# Patient Record
Sex: Male | Born: 1940 | Race: White | Hispanic: No | Marital: Married | Smoking: Former smoker
Health system: Southern US, Community
[De-identification: ages and names within clinical notes are randomized; demographics above are authoritative.]

## PROBLEM LIST (undated history)

## (undated) DIAGNOSIS — G510 Bell's palsy: Secondary | ICD-10-CM

## (undated) DIAGNOSIS — E7800 Pure hypercholesterolemia, unspecified: Secondary | ICD-10-CM

## (undated) DIAGNOSIS — I10 Essential (primary) hypertension: Secondary | ICD-10-CM

## (undated) DIAGNOSIS — K219 Gastro-esophageal reflux disease without esophagitis: Secondary | ICD-10-CM

## (undated) DIAGNOSIS — I2510 Atherosclerotic heart disease of native coronary artery without angina pectoris: Secondary | ICD-10-CM

## (undated) HISTORY — PX: CARDIAC CATHETERIZATION: SHX172

---

## 2009-01-09 ENCOUNTER — Ambulatory Visit (HOSPITAL_COMMUNITY): Admission: RE | Admit: 2009-01-09 | Discharge: 2009-01-09 | Payer: Self-pay | Admitting: Family Medicine

## 2012-03-18 HISTORY — PX: CORONARY STENT PLACEMENT: SHX1402

## 2012-04-23 ENCOUNTER — Encounter (HOSPITAL_COMMUNITY): Payer: Self-pay

## 2012-04-23 ENCOUNTER — Encounter (HOSPITAL_COMMUNITY)
Admission: RE | Admit: 2012-04-23 | Discharge: 2012-04-23 | Disposition: A | Discharge status: Home / Self Care | Payer: Medicare Other | Source: Ambulatory Visit | Attending: Internal Medicine | Admitting: Internal Medicine

## 2012-04-23 VITALS — BP 104/62 | HR 66 | Ht 69.0 in | Wt 211.2 lb

## 2012-04-23 DIAGNOSIS — Z5189 Encounter for other specified aftercare: Secondary | ICD-10-CM | POA: Insufficient documentation

## 2012-04-23 DIAGNOSIS — Z9861 Coronary angioplasty status: Secondary | ICD-10-CM

## 2012-04-23 HISTORY — DX: Atherosclerotic heart disease of native coronary artery without angina pectoris: I25.10

## 2012-04-23 NOTE — Patient Instructions (Signed)
Pt has finished orientation and is scheduled to start CR on 04/29/12 at 9:30. Pt has been instructed to arrive to class 15 minutes early for scheduled class. Pt has been instructed to wear comfortable clothing and shoes with rubber soles. Pt has been told to take their medications 1 hour prior to coming to class.  If the patient is not going to attend class, he/she has been instructed to call.

## 2012-04-23 NOTE — Progress Notes (Signed)
Patient was referred to Cardiac Rehab from [facility] by Dr. 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 Reviewed attendance and class policy. Talked about inclement weather and class consultation policy. Pt is scheduled to start Cardiac Rehab on 04/29/12 at 9:30. Pt was advised to come to class 5 minutes before class starts. He was also given instructions on meeting with the dietician and attending the Family Structure classes. Pt is eager to get started. Patient did 6 minute pre walk test.

## 2012-04-27 ENCOUNTER — Encounter (HOSPITAL_COMMUNITY): Payer: Medicare Other

## 2012-04-29 ENCOUNTER — Encounter (HOSPITAL_COMMUNITY)
Admission: RE | Admit: 2012-04-29 | Discharge: 2012-04-29 | Disposition: A | Discharge status: Home / Self Care | Payer: Medicare Other | Source: Ambulatory Visit | Attending: Internal Medicine | Admitting: Internal Medicine

## 2012-05-01 ENCOUNTER — Encounter (HOSPITAL_COMMUNITY)
Admission: RE | Admit: 2012-05-01 | Discharge: 2012-05-01 | Disposition: A | Discharge status: Home / Self Care | Payer: Medicare Other | Source: Ambulatory Visit | Attending: Internal Medicine | Admitting: Internal Medicine

## 2012-05-04 ENCOUNTER — Encounter (HOSPITAL_COMMUNITY)
Admission: RE | Admit: 2012-05-04 | Discharge: 2012-05-04 | Disposition: A | Discharge status: Home / Self Care | Payer: Medicare Other | Source: Ambulatory Visit | Attending: Internal Medicine | Admitting: Internal Medicine

## 2012-05-06 ENCOUNTER — Encounter (HOSPITAL_COMMUNITY): Payer: Medicare Other

## 2012-05-08 ENCOUNTER — Encounter (HOSPITAL_COMMUNITY)
Admission: RE | Admit: 2012-05-08 | Discharge: 2012-05-08 | Disposition: A | Discharge status: Home / Self Care | Payer: Medicare Other | Source: Ambulatory Visit | Attending: Internal Medicine | Admitting: Internal Medicine

## 2012-05-11 ENCOUNTER — Encounter (HOSPITAL_COMMUNITY)
Admission: RE | Admit: 2012-05-11 | Discharge: 2012-05-11 | Disposition: A | Discharge status: Home / Self Care | Payer: Medicare Other | Source: Ambulatory Visit | Attending: Internal Medicine | Admitting: Internal Medicine

## 2012-05-11 DIAGNOSIS — Z9861 Coronary angioplasty status: Secondary | ICD-10-CM | POA: Insufficient documentation

## 2012-05-11 DIAGNOSIS — Z5189 Encounter for other specified aftercare: Secondary | ICD-10-CM | POA: Insufficient documentation

## 2012-05-13 ENCOUNTER — Encounter (HOSPITAL_COMMUNITY)
Admission: RE | Admit: 2012-05-13 | Discharge: 2012-05-13 | Disposition: A | Discharge status: Home / Self Care | Payer: Medicare Other | Source: Ambulatory Visit | Attending: Internal Medicine | Admitting: Internal Medicine

## 2012-05-15 ENCOUNTER — Encounter (HOSPITAL_COMMUNITY)
Admission: RE | Admit: 2012-05-15 | Discharge: 2012-05-15 | Disposition: A | Discharge status: Home / Self Care | Payer: Medicare Other | Source: Ambulatory Visit | Attending: Internal Medicine | Admitting: Internal Medicine

## 2012-05-18 ENCOUNTER — Encounter (HOSPITAL_COMMUNITY)
Admission: RE | Admit: 2012-05-18 | Discharge: 2012-05-18 | Disposition: A | Discharge status: Home / Self Care | Payer: Medicare Other | Source: Ambulatory Visit | Attending: Internal Medicine | Admitting: Internal Medicine

## 2012-05-20 ENCOUNTER — Encounter (HOSPITAL_COMMUNITY)
Admission: RE | Admit: 2012-05-20 | Discharge: 2012-05-20 | Disposition: A | Discharge status: Home / Self Care | Payer: Medicare Other | Source: Ambulatory Visit | Attending: Internal Medicine | Admitting: Internal Medicine

## 2012-05-22 ENCOUNTER — Encounter (HOSPITAL_COMMUNITY): Payer: Medicare Other

## 2012-05-25 ENCOUNTER — Encounter (HOSPITAL_COMMUNITY)
Admission: RE | Admit: 2012-05-25 | Discharge: 2012-05-25 | Disposition: A | Discharge status: Home / Self Care | Payer: Medicare Other | Source: Ambulatory Visit | Attending: Internal Medicine | Admitting: Internal Medicine

## 2012-05-27 ENCOUNTER — Encounter (HOSPITAL_COMMUNITY)
Admission: RE | Admit: 2012-05-27 | Discharge: 2012-05-27 | Disposition: A | Discharge status: Home / Self Care | Payer: Medicare Other | Source: Ambulatory Visit | Attending: Internal Medicine | Admitting: Internal Medicine

## 2012-05-29 ENCOUNTER — Encounter (HOSPITAL_COMMUNITY)
Admission: RE | Admit: 2012-05-29 | Discharge: 2012-05-29 | Disposition: A | Discharge status: Home / Self Care | Payer: Medicare Other | Source: Ambulatory Visit | Attending: Internal Medicine | Admitting: Internal Medicine

## 2012-06-01 ENCOUNTER — Encounter (HOSPITAL_COMMUNITY)
Admission: RE | Admit: 2012-06-01 | Discharge: 2012-06-01 | Disposition: A | Discharge status: Home / Self Care | Payer: Medicare Other | Source: Ambulatory Visit | Attending: Internal Medicine | Admitting: Internal Medicine

## 2012-06-03 ENCOUNTER — Encounter (HOSPITAL_COMMUNITY)
Admission: RE | Admit: 2012-06-03 | Discharge: 2012-06-03 | Disposition: A | Discharge status: Home / Self Care | Payer: Medicare Other | Source: Ambulatory Visit | Attending: Internal Medicine | Admitting: Internal Medicine

## 2012-06-05 ENCOUNTER — Encounter (HOSPITAL_COMMUNITY)
Admission: RE | Admit: 2012-06-05 | Discharge: 2012-06-05 | Disposition: A | Discharge status: Home / Self Care | Payer: Medicare Other | Source: Ambulatory Visit | Attending: Internal Medicine | Admitting: Internal Medicine

## 2012-06-08 ENCOUNTER — Encounter (HOSPITAL_COMMUNITY)
Admission: RE | Admit: 2012-06-08 | Discharge: 2012-06-08 | Disposition: A | Discharge status: Home / Self Care | Payer: Medicare Other | Source: Ambulatory Visit | Attending: Internal Medicine | Admitting: Internal Medicine

## 2012-06-08 DIAGNOSIS — Z5189 Encounter for other specified aftercare: Secondary | ICD-10-CM | POA: Insufficient documentation

## 2012-06-08 DIAGNOSIS — Z9861 Coronary angioplasty status: Secondary | ICD-10-CM | POA: Insufficient documentation

## 2012-06-10 ENCOUNTER — Encounter (HOSPITAL_COMMUNITY)
Admission: RE | Admit: 2012-06-10 | Discharge: 2012-06-10 | Disposition: A | Discharge status: Home / Self Care | Payer: Medicare Other | Source: Ambulatory Visit | Attending: Internal Medicine | Admitting: Internal Medicine

## 2012-06-12 ENCOUNTER — Encounter (HOSPITAL_COMMUNITY): Payer: Medicare Other

## 2012-06-15 ENCOUNTER — Encounter (HOSPITAL_COMMUNITY)
Admission: RE | Admit: 2012-06-15 | Discharge: 2012-06-15 | Disposition: A | Discharge status: Home / Self Care | Payer: Medicare Other | Source: Ambulatory Visit | Attending: Internal Medicine | Admitting: Internal Medicine

## 2012-06-17 ENCOUNTER — Encounter (HOSPITAL_COMMUNITY)
Admission: RE | Admit: 2012-06-17 | Discharge: 2012-06-17 | Disposition: A | Discharge status: Home / Self Care | Payer: Medicare Other | Source: Ambulatory Visit | Attending: Internal Medicine | Admitting: Internal Medicine

## 2012-06-19 ENCOUNTER — Encounter (HOSPITAL_COMMUNITY)
Admission: RE | Admit: 2012-06-19 | Discharge: 2012-06-19 | Disposition: A | Discharge status: Home / Self Care | Payer: Medicare Other | Source: Ambulatory Visit | Attending: Internal Medicine | Admitting: Internal Medicine

## 2012-06-22 ENCOUNTER — Encounter (HOSPITAL_COMMUNITY)
Admission: RE | Admit: 2012-06-22 | Discharge: 2012-06-22 | Disposition: A | Discharge status: Home / Self Care | Payer: Medicare Other | Source: Ambulatory Visit | Attending: Internal Medicine | Admitting: Internal Medicine

## 2012-06-24 ENCOUNTER — Encounter (HOSPITAL_COMMUNITY)
Admission: RE | Admit: 2012-06-24 | Discharge: 2012-06-24 | Disposition: A | Discharge status: Home / Self Care | Payer: Medicare Other | Source: Ambulatory Visit | Attending: Internal Medicine | Admitting: Internal Medicine

## 2012-06-26 ENCOUNTER — Encounter (HOSPITAL_COMMUNITY)
Admission: RE | Admit: 2012-06-26 | Discharge: 2012-06-26 | Disposition: A | Discharge status: Home / Self Care | Payer: Medicare Other | Source: Ambulatory Visit | Attending: Internal Medicine | Admitting: Internal Medicine

## 2012-06-29 ENCOUNTER — Encounter (HOSPITAL_COMMUNITY)
Admission: RE | Admit: 2012-06-29 | Discharge: 2012-06-29 | Disposition: A | Discharge status: Home / Self Care | Payer: Medicare Other | Source: Ambulatory Visit | Attending: Internal Medicine | Admitting: Internal Medicine

## 2012-07-01 ENCOUNTER — Encounter (HOSPITAL_COMMUNITY)
Admission: RE | Admit: 2012-07-01 | Discharge: 2012-07-01 | Disposition: A | Discharge status: Home / Self Care | Payer: Medicare Other | Source: Ambulatory Visit | Attending: Internal Medicine | Admitting: Internal Medicine

## 2012-07-03 ENCOUNTER — Encounter (HOSPITAL_COMMUNITY)
Admission: RE | Admit: 2012-07-03 | Discharge: 2012-07-03 | Disposition: A | Discharge status: Home / Self Care | Payer: Medicare Other | Source: Ambulatory Visit | Attending: Internal Medicine | Admitting: Internal Medicine

## 2012-07-06 ENCOUNTER — Encounter (HOSPITAL_COMMUNITY)
Admission: RE | Admit: 2012-07-06 | Discharge: 2012-07-06 | Disposition: A | Discharge status: Home / Self Care | Payer: Medicare Other | Source: Ambulatory Visit | Attending: Internal Medicine | Admitting: Internal Medicine

## 2012-07-08 ENCOUNTER — Encounter (HOSPITAL_COMMUNITY)
Admission: RE | Admit: 2012-07-08 | Discharge: 2012-07-08 | Disposition: A | Discharge status: Home / Self Care | Payer: Medicare Other | Source: Ambulatory Visit | Attending: Internal Medicine | Admitting: Internal Medicine

## 2012-07-08 DIAGNOSIS — Z9861 Coronary angioplasty status: Secondary | ICD-10-CM | POA: Insufficient documentation

## 2012-07-08 DIAGNOSIS — Z5189 Encounter for other specified aftercare: Secondary | ICD-10-CM | POA: Insufficient documentation

## 2012-07-10 ENCOUNTER — Encounter (HOSPITAL_COMMUNITY)
Admission: RE | Admit: 2012-07-10 | Discharge: 2012-07-10 | Disposition: A | Discharge status: Home / Self Care | Payer: Medicare Other | Source: Ambulatory Visit | Attending: Internal Medicine | Admitting: Internal Medicine

## 2012-07-13 ENCOUNTER — Encounter (HOSPITAL_COMMUNITY)
Admission: RE | Admit: 2012-07-13 | Discharge: 2012-07-13 | Disposition: A | Discharge status: Home / Self Care | Payer: Medicare Other | Source: Ambulatory Visit | Attending: Internal Medicine | Admitting: Internal Medicine

## 2012-07-15 ENCOUNTER — Encounter (HOSPITAL_COMMUNITY)
Admission: RE | Admit: 2012-07-15 | Discharge: 2012-07-15 | Disposition: A | Discharge status: Home / Self Care | Payer: Medicare Other | Source: Ambulatory Visit | Attending: Internal Medicine | Admitting: Internal Medicine

## 2012-07-17 ENCOUNTER — Encounter (HOSPITAL_COMMUNITY)
Admission: RE | Admit: 2012-07-17 | Discharge: 2012-07-17 | Disposition: A | Discharge status: Home / Self Care | Payer: Medicare Other | Source: Ambulatory Visit | Attending: Internal Medicine | Admitting: Internal Medicine

## 2012-07-20 ENCOUNTER — Encounter (HOSPITAL_COMMUNITY)
Admission: RE | Admit: 2012-07-20 | Discharge: 2012-07-20 | Disposition: A | Discharge status: Home / Self Care | Payer: Medicare Other | Source: Ambulatory Visit | Attending: Internal Medicine | Admitting: Internal Medicine

## 2012-07-22 ENCOUNTER — Encounter (HOSPITAL_COMMUNITY)
Admission: RE | Admit: 2012-07-22 | Discharge: 2012-07-22 | Disposition: A | Discharge status: Home / Self Care | Payer: Medicare Other | Source: Ambulatory Visit | Attending: Internal Medicine | Admitting: Internal Medicine

## 2012-07-24 ENCOUNTER — Encounter (HOSPITAL_COMMUNITY)
Admission: RE | Admit: 2012-07-24 | Discharge: 2012-07-24 | Disposition: A | Discharge status: Home / Self Care | Payer: Medicare Other | Source: Ambulatory Visit | Attending: Internal Medicine | Admitting: Internal Medicine

## 2012-07-27 ENCOUNTER — Encounter (HOSPITAL_COMMUNITY)
Admission: RE | Admit: 2012-07-27 | Discharge: 2012-07-27 | Disposition: A | Discharge status: Home / Self Care | Payer: Medicare Other | Source: Ambulatory Visit | Attending: Internal Medicine | Admitting: Internal Medicine

## 2012-07-29 ENCOUNTER — Encounter (HOSPITAL_COMMUNITY): Payer: Medicare Other

## 2012-07-31 ENCOUNTER — Encounter (HOSPITAL_COMMUNITY): Payer: Medicare Other

## 2012-07-31 NOTE — Progress Notes (Signed)
Cardiac Rehabilitation Program Outcomes Report   Orientation:  04/23/2012 1st week Report 05/04/2012 Graduate Date:  tbd Discharge Date:  tbd # of sessions completed: 3  Cardiologist: Dr Jaymes Graff Family MD:  Virl Axe Time:  09:30  A.  Exercise Program:  Tolerates exercise @ 3.84 METS for 15 minutes and Walk Test Results:  Pre: Pre Walk Test: HR 66, BP 104/62, O2 97%, RPE 6 and RPD 6, 6 min HR 110, BP 150/80, O2 98, RPE 9 and RPD 9. Post HR 74, BP 130/60 O2  100, RPE 6, and RPD 6. Walked 1700 ft at 3.2 mph. Mets 3.46  B.  Mental Health:  Good mental attitude  C.  Education/Instruction/Skills  Accurately checks own pulse.  Rest:  78  Exercise 118, Knows THR for exercise and Uses Perceived Exertion Scale and/or Dyspnea Scale  Uses Perceived Exertion Scale and/or Dyspnea Scale  D.  Nutrition/Weight Control/Body Composition:  Adherence to prescribed nutrition program: good    E.  Blood Lipids    No results found for this basename: CHOL, HDL, LDLCALC, LDLDIRECT, TRIG, CHOLHDL    F.  Lifestyle Changes:  Making positive lifestyle changes  G.  Symptoms noted with exercise:  Asymptomatic  Report Completed By:  Lelon Huh. Amadeus Oyama RN   Comments:   This is patients 1st week report. He achieved a peak METS of 3.84. His resting HR was 78 and resting BP was 152/72 and Peak HR was 118 and Peak BP was 140/82. A report will follow on his 18th visit his halfway point.

## 2012-08-05 NOTE — Progress Notes (Signed)
Cardiac Rehabilitation Program Outcomes Report   Orientation:  04/23/2012 Halfway report: 06/15/2012 Graduate Date:  tbd Discharge Date:  tbd # of sessions completed: 18 DX: Stent  Cardiologist: Jaymes Graff Family MD:  Virl Axe Time:  09:30  A.  Exercise Program:  Tolerates exercise @ 3.84 METS for 15 minutes  B.  Mental Health:  Good mental attitude  C.  Education/Instruction/Skills  Accurately checks own pulse.  Rest:  61  Exercise 115 , Knows THR for exercise and Uses Perceived Exertion Scale and/or Dyspnea Scale  Uses Perceived Exertion Scale and/or Dyspnea Scale  D.  Nutrition/Weight Control/Body Composition:  Adherence to prescribed nutrition program: good    E.  Blood Lipids    No results found for this basename: CHOL, HDL, LDLCALC, LDLDIRECT, TRIG, CHOLHDL    F.  Lifestyle Changes:  Making positive lifestyle changes  G.  Symptoms noted with exercise:  Asymptomatic  Report Completed By:  Lelon Huh. Bast RN   Comments:  This is patients halfway report He achieved a peak METS of 3.84. His resting HR was 61 and resting BP was 130/60, His peak HR was 115 and peak BP was 130/60. A graduation report will follow on his 36th visit.

## 2013-02-23 NOTE — Addendum Note (Signed)
Encounter addended by: Angelica Pou, RN on: 02/23/2013 10:08 AM<BR>     Documentation filed: Clinical Notes

## 2013-02-23 NOTE — Addendum Note (Signed)
Encounter addended by: Angelica Pou, RN on: 02/23/2013 10:05 AM<BR>     Documentation filed: Notes Section

## 2013-02-23 NOTE — Progress Notes (Signed)
Cardiac Rehabilitation Program Outcomes Report   Orientation:  04/23/2012 Graduate Date:  07/27/2012 Discharge Date:  07/27/2012 # of sessions completed: 36 DX: Stent X 1  Cardiologist: Jaymes Graff Family MD:  Virl Axe Time:  09:30  A.  Exercise Program:  Tolerates exercise @ 3.84 METS for 15 minutes and Walk Test Results:  Pre: Pre walk Test: Resting HR 66, Bp 104/62, O2 97% RPE 6 and RPD 6,  6 min Hr 110, BP 150/80,   B.  Mental Health:  Good mental attitude and Quality of Life (QOL)  changes:  Overall  9.09 %, Health/Functioning 9.36 %, Socioeconomics 5.26 %, Psych/Spiritual 14.29 %, Family 8.70 %    C.  Education/Instruction/Skills  Accurately checks own pulse.  Rest:  69  Exercise: 106, Knows THR for exercise, Uses Perceived Exertion Scale and/or Dyspnea Scale and Attended 11 education classes  Uses Perceived Exertion Scale and/or Dyspnea Scale  D.  Nutrition/Weight Control/Body Composition:  Adherence to prescribed nutrition program: good    E.  Blood Lipids    No results found for this basename: CHOL, HDL, LDLCALC, LDLDIRECT, TRIG, CHOLHDL    F.  Lifestyle Changes:  Making positive lifestyle changes  G.  Symptoms noted with exercise:  Asymptomatic  Report Completed By:  Lelon Huh. Kegan Shepardson RN   Comments: This is patients graduation report. He has done well while in rehab. He achieved a peak METS of 3.84. His resting HR was 69 and resting BP was 122/62. His peak HR was 106 and peak BP was 130/60. A call will be made in 1 month, 6 months, and 1 year following his discharge.

## 2013-10-17 ENCOUNTER — Emergency Department (HOSPITAL_COMMUNITY)
Admission: EM | Admit: 2013-10-17 | Discharge: 2013-10-17 | Disposition: A | Discharge status: Home / Self Care | Payer: Medicare Other | Attending: Emergency Medicine | Admitting: Emergency Medicine

## 2013-10-17 ENCOUNTER — Encounter (HOSPITAL_COMMUNITY): Payer: Self-pay | Admitting: Emergency Medicine

## 2013-10-17 ENCOUNTER — Emergency Department (HOSPITAL_COMMUNITY): Payer: Medicare Other

## 2013-10-17 DIAGNOSIS — Z9889 Other specified postprocedural states: Secondary | ICD-10-CM | POA: Insufficient documentation

## 2013-10-17 DIAGNOSIS — E7800 Pure hypercholesterolemia, unspecified: Secondary | ICD-10-CM | POA: Diagnosis not present

## 2013-10-17 DIAGNOSIS — I10 Essential (primary) hypertension: Secondary | ICD-10-CM | POA: Insufficient documentation

## 2013-10-17 DIAGNOSIS — Z79899 Other long term (current) drug therapy: Secondary | ICD-10-CM | POA: Diagnosis not present

## 2013-10-17 DIAGNOSIS — Z9861 Coronary angioplasty status: Secondary | ICD-10-CM | POA: Diagnosis not present

## 2013-10-17 DIAGNOSIS — K219 Gastro-esophageal reflux disease without esophagitis: Secondary | ICD-10-CM | POA: Diagnosis not present

## 2013-10-17 DIAGNOSIS — Z8669 Personal history of other diseases of the nervous system and sense organs: Secondary | ICD-10-CM | POA: Insufficient documentation

## 2013-10-17 DIAGNOSIS — Z87891 Personal history of nicotine dependence: Secondary | ICD-10-CM | POA: Insufficient documentation

## 2013-10-17 DIAGNOSIS — R209 Unspecified disturbances of skin sensation: Secondary | ICD-10-CM | POA: Insufficient documentation

## 2013-10-17 DIAGNOSIS — Z7982 Long term (current) use of aspirin: Secondary | ICD-10-CM | POA: Diagnosis not present

## 2013-10-17 DIAGNOSIS — R200 Anesthesia of skin: Secondary | ICD-10-CM

## 2013-10-17 DIAGNOSIS — Z7902 Long term (current) use of antithrombotics/antiplatelets: Secondary | ICD-10-CM | POA: Insufficient documentation

## 2013-10-17 DIAGNOSIS — I2510 Atherosclerotic heart disease of native coronary artery without angina pectoris: Secondary | ICD-10-CM | POA: Diagnosis not present

## 2013-10-17 HISTORY — DX: Essential (primary) hypertension: I10

## 2013-10-17 HISTORY — DX: Pure hypercholesterolemia, unspecified: E78.00

## 2013-10-17 HISTORY — DX: Bell's palsy: G51.0

## 2013-10-17 HISTORY — DX: Gastro-esophageal reflux disease without esophagitis: K21.9

## 2013-10-17 NOTE — ED Notes (Signed)
PT c/o numbness and tingling to left forearm to tips of fingers x1 day.

## 2013-10-17 NOTE — Discharge Instructions (Signed)
Head scan was normal.  Symptoms could be related to a neck problem. Followup your primary care Dr.

## 2013-10-17 NOTE — ED Provider Notes (Addendum)
CSN: [id number]     Arrival date & time 10/17/13  1016 History  This chart was scribed for Donnetta HutchingBrian Boe Deans, MD by Nicholos Johnsenise Iheanachor, ED scribe. This patient was seen in room APA18/APA18 and the patient's care was started at 10:37 AM.   Chief Complaint  Patient presents with  . Numbness    The history is provided by the patient. No language interpreter was used.   HPI Comments: Gregory Mcmillan is a 73 y.o. male w/ hx of Bells Palsy presents to the Emergency Department complaining of unchanged left arm numbness; onset approximately 19 hours ago. Numbness starts at the proximal forearm and moves down into the palmar and dorsal aspect of the hand. No numbness on the posterior aspect of the lower arm. States he thought it would resolve on its own but woke up this morning and numbness was still present. Pt has been doing a lot of desk work lately. Does not report any injury or trauma related to numbness. Does not recall any awkward positioning of the arm that may have resulted in the numbness. No other complaints or sxs. Denies visual disturbance or gait problem.   [facility]  Past Medical History  Diagnosis Date  . Coronary artery disease   . Hypertension   . High cholesterol   . GERD (gastroesophageal reflux disease)   . Bell's palsy    Past Surgical History  Procedure Laterality Date  . Cardiac catheterization    . Coronary stent placement  03/18/12   Family History  Problem Relation Age of Onset  . Heart disease Mother   . Heart attack Father    History  Substance Use Topics  . Smoking status: Former Smoker -- 3.00 packs/day for 10 years    Types: Cigars    Quit date: 04/08/1980  . Smokeless tobacco: Never Used  . Alcohol Use: No    Review of Systems  Eyes: Negative for visual disturbance.  Cardiovascular: Negative for chest pain.  Musculoskeletal: Negative for gait problem.  Neurological: Positive for numbness. Negative for dizziness, weakness and headaches.   All other systems reviewed and are negative.  A complete 10 system review of systems was obtained and all systems are negative except as noted in the HPI and PMH.   Allergies  Review of patient's allergies indicates no known allergies.  Home Medications   Prior to Admission medications   Medication Sig Start Date End Date Taking? Authorizing Provider  aspirin 81 MG tablet Take 81 mg by mouth daily.   Yes Historical Provider, MD  clopidogrel (PLAVIX) 75 MG tablet Take 75 mg by mouth daily.   Yes Historical Provider, MD  finasteride (PROSCAR) 5 MG tablet Take 5 mg by mouth daily.   Yes Historical Provider, MD  metoprolol tartrate (LOPRESSOR) 25 MG tablet Take 12.5 mg by mouth daily.    Yes Historical Provider, MD  Multiple Vitamin (MULTIVITAMIN WITH MINERALS) TABS tablet Take 1 tablet by mouth daily.   Yes Historical Provider, MD  omeprazole (PRILOSEC) 20 MG capsule Take 20 mg by mouth daily.   Yes Historical Provider, MD  simvastatin (ZOCOR) 20 MG tablet Take 20 mg by mouth daily.   Yes Historical Provider, MD  Travoprost, BAK Free, (TRAVATAN) 0.004 % SOLN ophthalmic solution Place 1 drop into both eyes at bedtime.   Yes Historical Provider, MD   BP 141/79  Pulse 63  Temp(Src) 98.4 F (36.9 C) (Oral)  Resp 15  Ht 5\' 9"  (1.753 m)  Wt  204 lb (92.534 kg)  BMI 30.11 kg/m2  SpO2 97%  Physical Exam  Nursing note and vitals reviewed. Constitutional: He is oriented to person, place, and time. He appears well-developed and well-nourished.  HENT:  Head: Normocephalic and atraumatic.  Eyes: Conjunctivae and EOM are normal. Pupils are equal, round, and reactive to light.  Neck: Normal range of motion. Neck supple.  Cardiovascular: Normal rate, regular rhythm and normal heart sounds.   Pulmonary/Chest: Effort normal and breath sounds normal.  Abdominal: Soft. Bowel sounds are normal.  Musculoskeletal: Normal range of motion.  Neurological: He is alert and oriented to person, place, and  time.  Numbness on the anterior aspect of forearm and palmar and dorsal aspect of the hand. Did not appreciate any bony abnormality.  Skin: Skin is warm and dry.  Psychiatric: He has a normal mood and affect. His behavior is normal.    ED Course  Procedures (including critical care time) DIAGNOSTIC STUDIES: Oxygen Saturation is 97% on room air, normal by my interpretation.    COORDINATION OF CARE: At 10:42 AM: Discussed treatment plan with patient which includes CT scan of the head. Patient agrees.   Labs Review Labs Reviewed - No data to display  Imaging Review No results found.   EKG Interpretation   Date/Time:  Sunday October 17 2013 10:30:16 EDT Ventricular Rate:  63 PR Interval:  216 QRS Duration: 95 QT Interval:  404 QTC Calculation: 413 R Axis:   -23 Text Interpretation:  Sinus rhythm Borderline prolonged PR interval  Borderline left axis deviation Baseline wander in lead(s) II Confirmed by  Zerick Prevette  MD, Porshia Blizzard ([id number]) on 10/17/2013 10:33:55 AM      MDM   Final diagnoses:  Left arm numbness  CT head neg.  No obvious neurovascular issues.  Pt has primary care F/U  I personally performed the services described in this documentation, which was scribed in my presence. The recorded information has been reviewed and is accurate.     Donnetta Hutching, MD 10/28/13 1600  Donnetta Hutching, MD 10/28/13 1600  Donnetta Hutching, MD 10/28/13 [id number]  Donnetta Hutching, MD 10/28/13 [phone]

## 2013-10-17 NOTE — ED Notes (Signed)
Patient c/o left arm numbness that started yesterday. Patient states "I thought it would just go away but I woke up with it numb." Patient denies any chest pain., dizziness, weakness, slurred speech, or headache.

## 2015-09-08 ENCOUNTER — Ambulatory Visit (INDEPENDENT_AMBULATORY_CARE_PROVIDER_SITE_OTHER): Payer: Medicare Other | Admitting: Urology

## 2015-09-08 DIAGNOSIS — N5201 Erectile dysfunction due to arterial insufficiency: Secondary | ICD-10-CM

## 2015-09-20 ENCOUNTER — Other Ambulatory Visit: Payer: Self-pay | Admitting: Neurology

## 2015-09-20 DIAGNOSIS — F0390 Unspecified dementia without behavioral disturbance: Secondary | ICD-10-CM

## 2015-09-28 ENCOUNTER — Ambulatory Visit (HOSPITAL_COMMUNITY)
Admission: RE | Admit: 2015-09-28 | Discharge: 2015-09-28 | Disposition: A | Discharge status: Home / Self Care | Payer: Medicare Other | Source: Ambulatory Visit | Attending: Neurology | Admitting: Neurology

## 2015-09-28 DIAGNOSIS — F0390 Unspecified dementia without behavioral disturbance: Secondary | ICD-10-CM | POA: Insufficient documentation

## 2015-09-28 DIAGNOSIS — R930 Abnormal findings on diagnostic imaging of skull and head, not elsewhere classified: Secondary | ICD-10-CM | POA: Diagnosis not present

## 2015-09-28 DIAGNOSIS — I6782 Cerebral ischemia: Secondary | ICD-10-CM | POA: Diagnosis not present

## 2015-09-28 DIAGNOSIS — G319 Degenerative disease of nervous system, unspecified: Secondary | ICD-10-CM | POA: Insufficient documentation

## 2015-11-03 ENCOUNTER — Ambulatory Visit: Payer: Medicare Other | Admitting: Urology

## 2015-11-10 ENCOUNTER — Ambulatory Visit (INDEPENDENT_AMBULATORY_CARE_PROVIDER_SITE_OTHER): Payer: Medicare Other | Admitting: Urology

## 2015-11-10 DIAGNOSIS — N5201 Erectile dysfunction due to arterial insufficiency: Secondary | ICD-10-CM | POA: Diagnosis not present

## 2016-01-19 ENCOUNTER — Ambulatory Visit (INDEPENDENT_AMBULATORY_CARE_PROVIDER_SITE_OTHER): Payer: Medicare Other | Admitting: Urology

## 2016-01-19 DIAGNOSIS — N5201 Erectile dysfunction due to arterial insufficiency: Secondary | ICD-10-CM

## 2018-03-25 IMAGING — MR MR HEAD W/O CM
9 of 12 series · 28 of 48 positions shown · non-contrast
Comparison: Head CT 10/17/2013 and MRI 01/09/2009

CLINICAL DATA: Dementia.  Short-term memory loss for 6 months.

EXAM:
MRI HEAD WITHOUT CONTRAST
TECHNIQUE: Multiplanar, multiecho pulse sequences of the brain and surrounding
structures were obtained without intravenous contrast.

[Series 3: t1_fl2d_sag · sagittal · 5.0mm · 0.42mm/px · 3 of 20 slices shown]
[im 1/20]
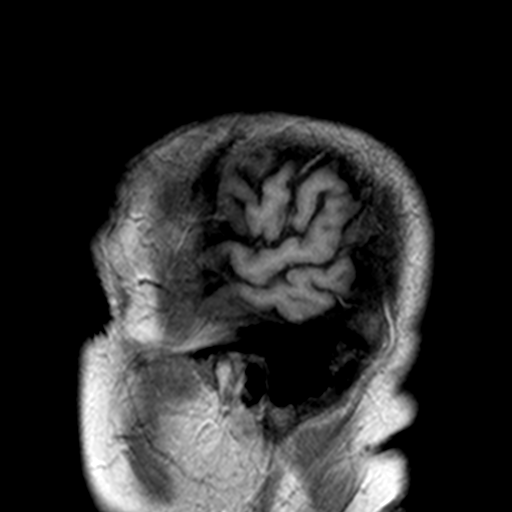
[im 10/20]
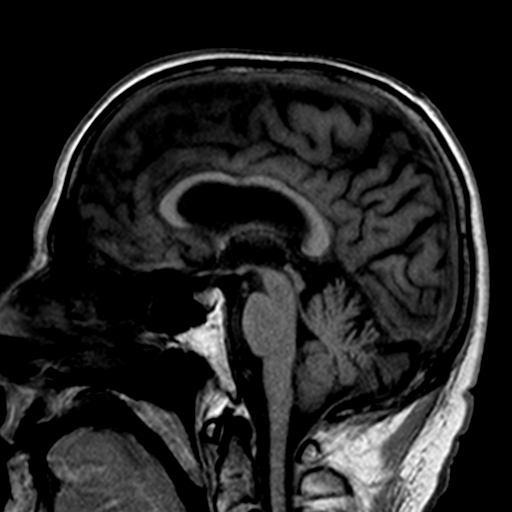
[im 20/20]
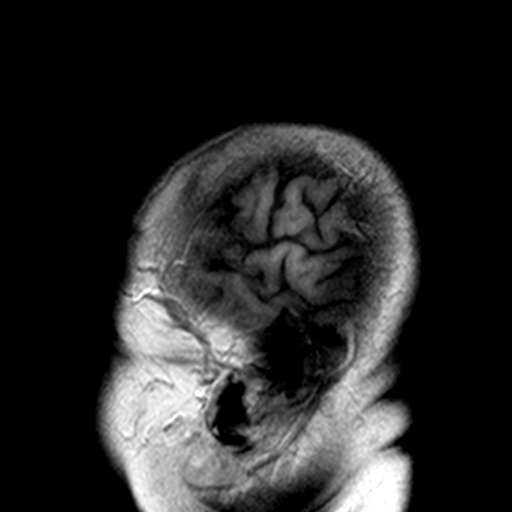

[Series 6: T2 · axial · 5.0mm · 0.60mm/px · z∈[-60,+82]mm · 2 of 23 slices shown (1 of 3)]
[im 1/23]
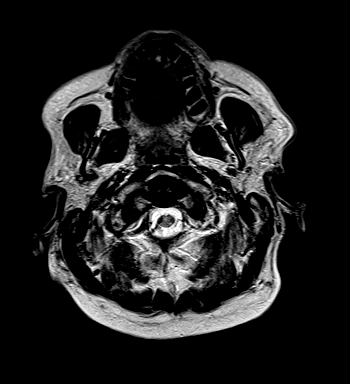
[im 23/23]
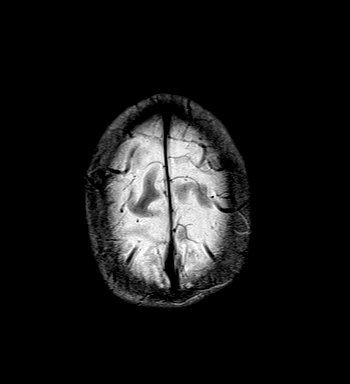

[Series 7: FLAIR · axial · 5.0mm · 0.34mm/px · z∈[-61,+81]mm · 2 of 23 slices shown (1 of 2)]
[im 1/23]
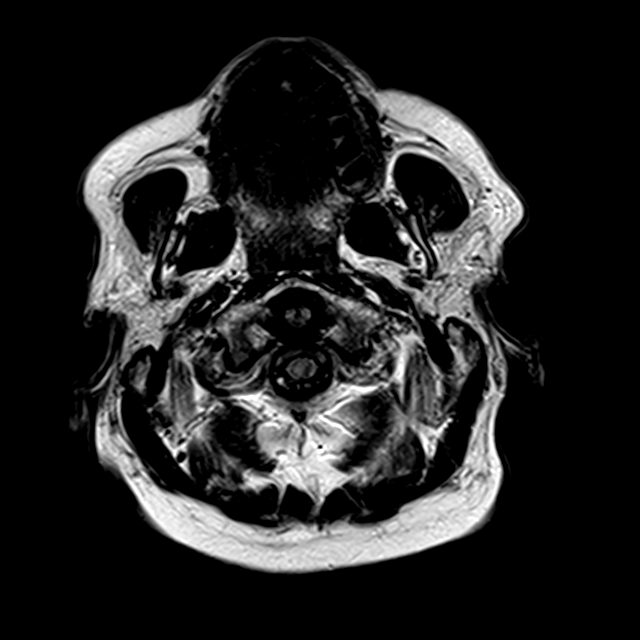
[im 23/23]
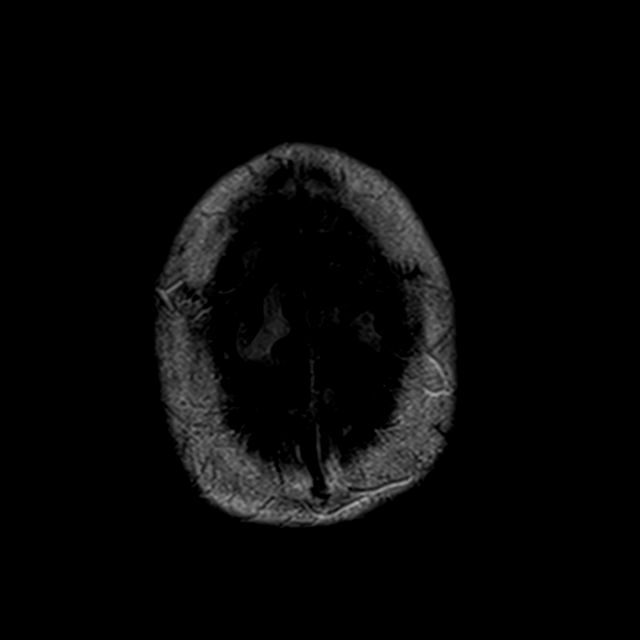

[Series 8: T1 · axial · 2.0mm · 0.45mm/px · z∈[-73,+114]mm · 8 of 95 slices shown]
[im 1/95]
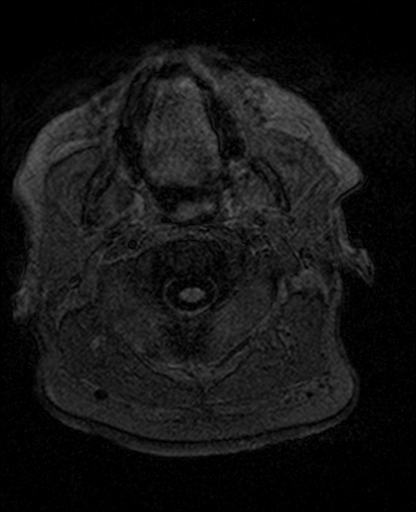
[im 11/95]
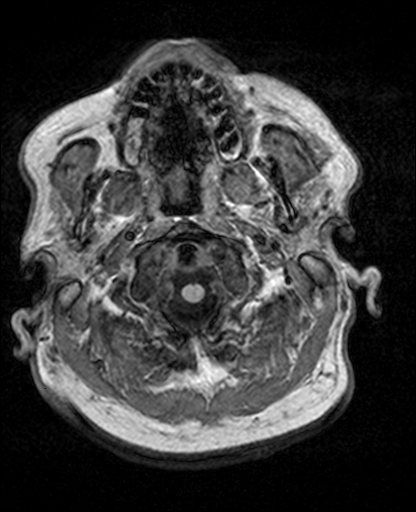
[im 32/95]
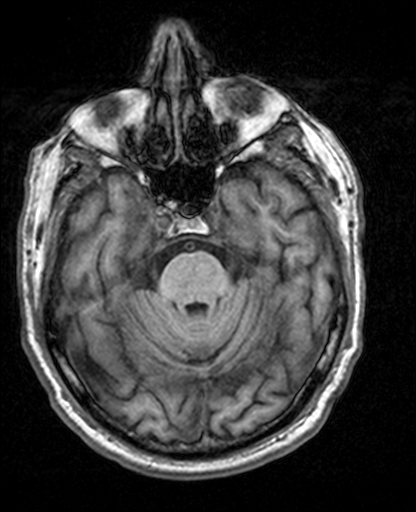
[im 42/95]
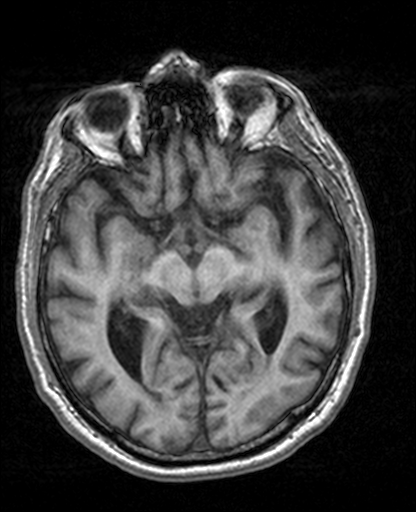
[im 53/95]
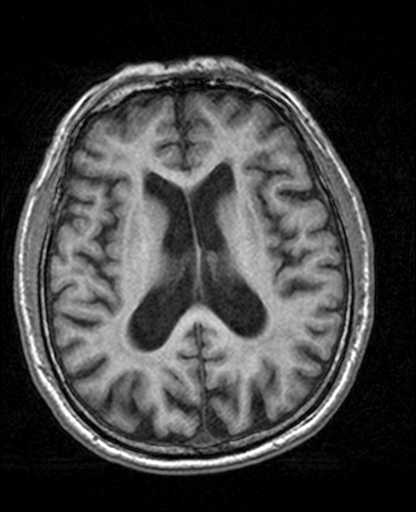
[im 63/95]
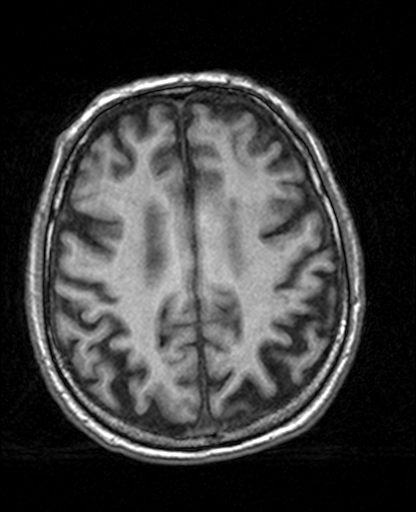
[im 84/95]
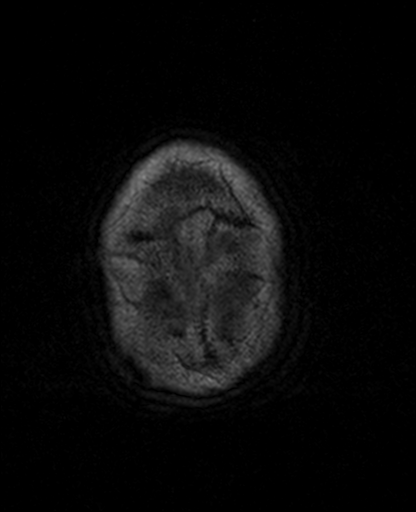
[im 95/95]
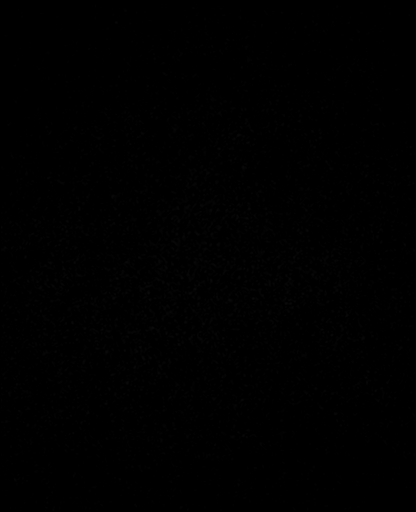

[Series 9: trauma axial · axial · 5.0mm · 0.45mm/px · z∈[-54,+75]mm · 2 of 21 slices shown]
[im 1/21]
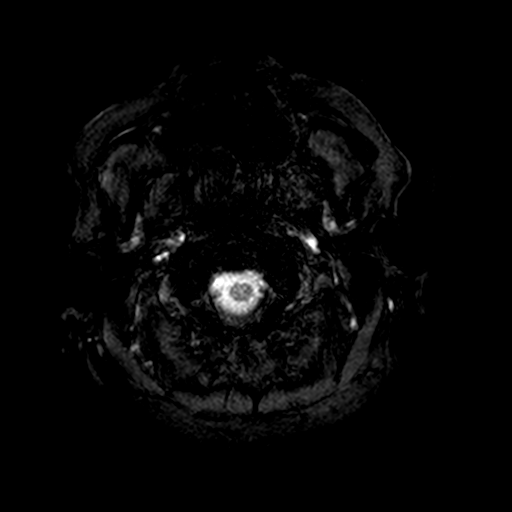
[im 21/21]
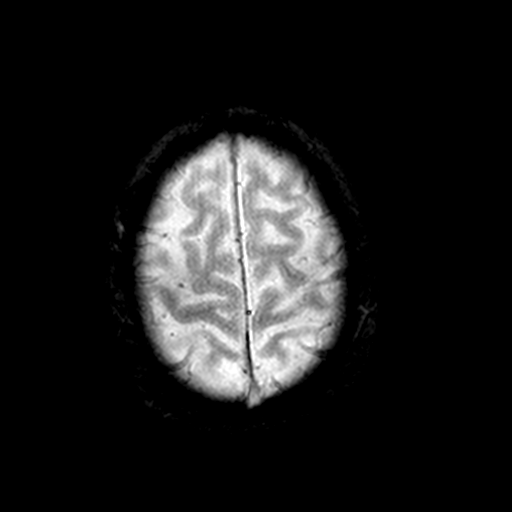

[Series 10: T2 · coronal · 5.0mm · 0.62mm/px · 3 of 26 slices shown (2 of 3)]
[im 1/26]
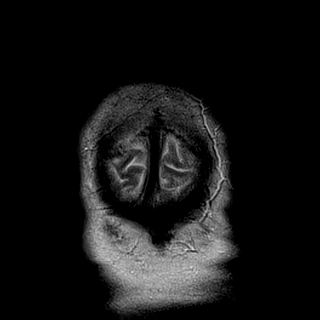
[im 13/26]
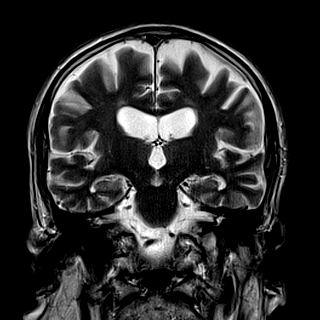
[im 26/26]
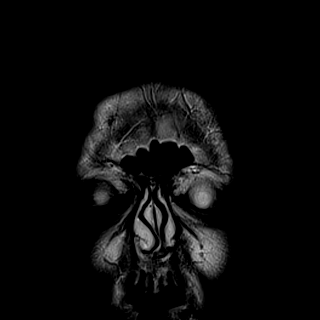

[Series 11: FLAIR · axial · 5.0mm · 0.94mm/px · z∈[-60,+82]mm · 2 of 23 slices shown (2 of 2)]
[im 1/23]
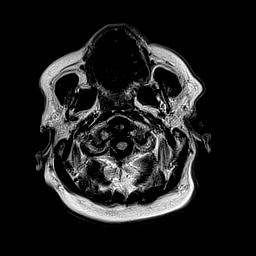
[im 23/23]
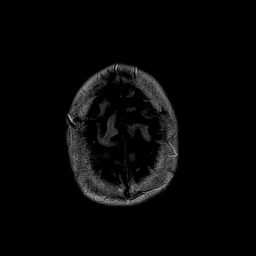

[Series 13: T2 · axial · 5.0mm · 0.75mm/px · z∈[-60,+82]mm · 2 of 23 slices shown (3 of 3)]
[im 1/23]
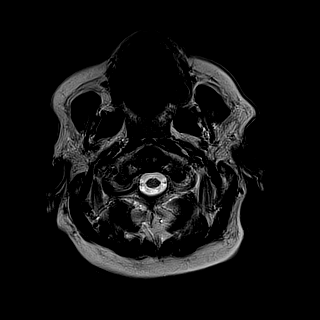
[im 23/23]
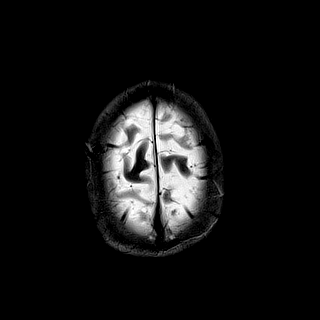

[Series 100: <mpr thick range> · axial · 3.0mm · 0.82mm/px · z∈[-53,+47]mm · 4 of 45 slices shown]
[im 1/45]
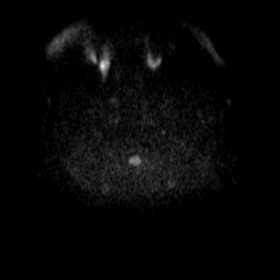
[im 12/45]
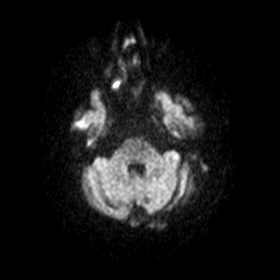
[im 23/45]
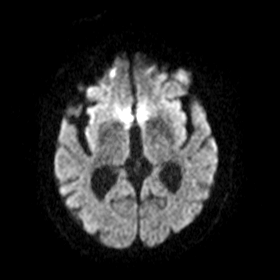
[im 34/45]
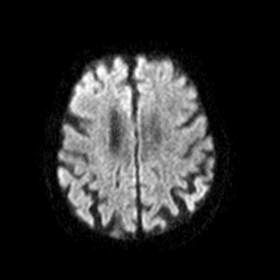

[28 of 48 positions shown; findings below may reference images not displayed]

FINDINGS: There is no evidence of acute infarct, intracranial hemorrhage,
mass, midline shift, or extra-axial fluid collection. Mild to
moderate generalized cerebral atrophy is stable to minimally
increased from the 3474 and MRI. T2 hyperintensities in the cerebral
white matter have mildly progressed and are nonspecific but
compatible with mild-to-moderate chronic small vessel ischemic
disease.

Prior bilateral cataract extraction is noted. Paranasal sinuses and
mastoid air cells are clear. Major intracranial arterial flow voids
are preserved. There is new T2 hyperintensity involving the non
dominant left transverse sinus, left sigmoid sinus, and left jugular
bulb.
IMPRESSION: 1. No acute infarct or mass.
2. Mild-to-moderate chronic small vessel ischemic disease and
cerebral atrophy, mildly progressed from 3474.
3. Abnormal appearance of the left transverse and left sigmoid
sinuses, favored to reflect slow flow in the non dominant left-sided
system although thrombosis is also possible given the changed
appearance from 3474. MRV could be performed if clinically
warranted.

## 2022-06-07 DEATH — deceased
